# Patient Record
Sex: Female | Born: 2000 | Hispanic: Yes | Smoking: Never smoker
Health system: Southern US, Community
[De-identification: ages and names within clinical notes are randomized; demographics above are authoritative.]

---
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
## 2023-05-19 DIAGNOSIS — F320 Major depressive disorder, single episode, mild: Secondary | ICD-10-CM

## 2023-05-19 NOTE — Progress Notes (Signed)
   05/19/23 1305  [facility] Triage Screening (Walk-ins at [facility] only)  How Did You Hear About [place]? Family/Friend  What Is the Reason for Your Visit/Call Today? Robyn Carter is a 22 year old female who presents to [facility] voluntarily accompanied by her sister and father due to crying spells. Pt reports ongoing crying spells for the past 5 days. Pt states she cannot identify a trigger She also reports fatigue, decreased appetite, and loss of interest. Pt states she is not established with outpatient therapy or psychiatry services at this time. She states she is not prescribed medication for her symptoms. Pt reports being diagnosed with MDD,PTSD and anxiety.Pt states she lives with her parents and sister.  She reports one prior suicide attempt when she was 22 years old where she took pills, but states she never told anyone and was not hospitalized. Pt states she has never been hospitalized for psychiatric reasons. She reports daily marijuana use, last use was today. She denies NSSIB.Pt denies SI/HI and AVH.  How Long Has This Been Causing You Problems? <Week  Have You Recently Had Any Thoughts About Hurting Yourself? No  Are You Planning to Commit Suicide/Harm Yourself At This time? No  Have you Recently Had Thoughts About Hurting Someone Karolee Ohs? No  Are You Planning To Harm Someone At This Time? No  Are you currently experiencing any auditory, visual or other hallucinations? No  Have You Used Any Alcohol or Drugs in the Past 24 Hours? Yes  How long ago did you use Drugs or Alcohol? today  What Did You Use and How Much? marijuana  Do you have any current medical co-morbidities that require immediate attention? No  Clinician description of patient physical appearance/behavior: tearful, casually dressed  What Do You Feel Would Help You the Most Today? Treatment for Depression or other mood problem  If access to [facility] was not available, would you have sought care in the Emergency Department? No   Determination of Need Routine (7 days)  Options For Referral Outpatient Therapy;Medication Management

## 2023-05-19 NOTE — ED Provider Notes (Signed)
Behavioral Health Urgent Care Medical Screening Exam  Patient Name: Robyn Carter MRN: [id number] Date of Evaluation: 05/19/23 Chief Complaint:  " I have been crying for the past 5 days, but it's getting better." Diagnosis:  Final diagnoses:  None    History of Present illness: Robyn Carter is a 22 y.o. female.  Presents to [facility] urgent care accompanied by her sister and father.  Patient was interviewed independently of her family.  She reports her family had concerns due to her crying for the past 5 days.  She denied any recent stressors or triggers.  States she is unsure what brought on the crying spells.  She denied that she is followed by therapy or psychiatry currently.  States that she was followed by the DBT therapy in the past.  She does report 1 previous suicide attempt when she was 22 years old.  Denied that she was hospitalized at that time.  She denied that she is prescribed any psychotropic medications currently.  Does admit to daily marijuana use.  She denied auditory or visual hallucinations.  Denies suicidal or homicidal ideations.  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   She reports a family history related to mental illness.  States her uncle is diagnosed with bipolar disorder.  Consideration for partial and/or intensive outpatient programming.  Patient to establish care with outpatient provider.  Patient encouraged to contact the back of her insurance card to see who is in her network.  Patient appeared receptive to plan.  She provided verbal authorization to speak to her sister Cleophas Gaydon  for additional collateral.  [name] denied any safety concerns with patient returning home. Stated they all reside together.  She did report concerns related to patient's depressed mood.  Denied the patient has access to guns or weapons.  She denied history related to self injures behaviors i.e. cutting or burning.  Discussed following up with [facility]  urgent care 24-hour walk-in if any safety concerns arise.  Encouraged to follow-up outpatient [facility] throughput services.    During evaluation [name] who is not in any acute distress. She is alert/oriented x 4; calm/cooperative; and mood congruent with affect. She is speaking in a clear tone at moderate volume, and normal pace; with good eye contact. Her thought process is coherent and relevant; There is no indication that she is currently responding to internal/external stimuli or experiencing delusional thought content; and she has denied suicidal/self-harm/homicidal ideation, psychosis, and paranoia.   Patient has remained calm throughout assessment and has answered questions appropriately.    [name] is educated and verbalizes understanding of mental health resources and other crisis services in the community. She is instructed to call 911 and present to the nearest emergency room should she experience any suicidal/homicidal ideation, auditory/visual/hallucinations, or detrimental worsening of her mental health condition.She was a also advised by Clinical research associate that she could call the toll-free phone on insurance card to assist with identifying in network counselors and agencies or number on back of Medicaid card t speak with care coordinator     Psychiatric Specialty Exam  Presentation  General Appearance:Appropriate for Environment  Eye Contact:Good  Speech:Clear and Coherent  Speech Volume:Normal  Handedness:Right   Mood and Affect  Mood: Anxious; Depressed  Affect: Congruent   Thought Process  Thought Processes: Coherent  Descriptions of Associations:Intact  Orientation:Full (Time, Place and Person)  Thought Content:Logical    Hallucinations:None  Ideas of Reference:None  Suicidal Thoughts:No  Homicidal Thoughts:No  Sensorium  Memory: Immediate Fair; Recent Fair  Judgment: Fair  Insight: Good   Executive Functions   Concentration: Fair  Attention Span: Fair  Recall: Good  Fund of Knowledge: Good  Language: Good   Psychomotor Activity  Psychomotor Activity: Normal   Assets  Assets: Desire for Improvement; Social Support   Sleep  Sleep: Fair  Number of hours: No data recorded  Physical Exam: Physical Exam Vitals and nursing note reviewed.  Cardiovascular:     Rate and Rhythm: Normal rate and regular rhythm.  Psychiatric:        Mood and Affect: Mood normal.        Thought Content: Thought content normal.    Review of Systems  Psychiatric/Behavioral:  Positive for depression and substance abuse. Negative for suicidal ideas. The patient is nervous/anxious.   All other systems reviewed and are negative.  There were no vitals taken for this visit. There is no height or weight on file to calculate BMI.  Musculoskeletal: Strength & Muscle Tone: within normal limits Gait & Station: normal Patient leans: N/A   [facility] MSE Discharge Disposition for Follow up and Recommendations: Based on my evaluation the patient does not appear to have an emergency medical condition and can be discharged with resources and follow up care in outpatient services for Medication Management and Partial Hospitalization Program -Outpatient resources was provided -Consideration for open access   [name], NP 05/19/2023, 1:11 PM
